# Patient Record
Sex: Female | Born: 1955 | Race: White | Hispanic: No | Marital: Married | Smoking: Never smoker
Health system: Southern US, Community
[De-identification: ages and names within clinical notes are randomized; demographics above are authoritative.]

## PROBLEM LIST (undated history)

## (undated) DIAGNOSIS — Z8489 Family history of other specified conditions: Secondary | ICD-10-CM

## (undated) DIAGNOSIS — M170 Bilateral primary osteoarthritis of knee: Secondary | ICD-10-CM

## (undated) DIAGNOSIS — K635 Polyp of colon: Secondary | ICD-10-CM

## (undated) DIAGNOSIS — K219 Gastro-esophageal reflux disease without esophagitis: Secondary | ICD-10-CM

## (undated) DIAGNOSIS — I10 Essential (primary) hypertension: Secondary | ICD-10-CM

## (undated) DIAGNOSIS — J189 Pneumonia, unspecified organism: Secondary | ICD-10-CM

## (undated) DIAGNOSIS — E559 Vitamin D deficiency, unspecified: Secondary | ICD-10-CM

## (undated) DIAGNOSIS — T884XXA Failed or difficult intubation, initial encounter: Secondary | ICD-10-CM

## (undated) HISTORY — PX: BREAST CYST ASPIRATION: SHX578

## (undated) HISTORY — PX: TONSILLECTOMY: SUR1361

## (undated) HISTORY — PX: CHOLECYSTECTOMY: SHX55

## (undated) HISTORY — PX: COLON SURGERY: SHX602

---

## 2004-05-24 ENCOUNTER — Ambulatory Visit: Payer: Self-pay | Admitting: Internal Medicine

## 2004-09-19 ENCOUNTER — Other Ambulatory Visit: Payer: Self-pay

## 2004-09-23 ENCOUNTER — Ambulatory Visit: Payer: Self-pay | Admitting: Obstetrics and Gynecology

## 2004-10-25 HISTORY — PX: ABDOMINAL HYSTERECTOMY: SHX81

## 2004-11-07 ENCOUNTER — Inpatient Hospital Stay: Payer: Self-pay | Admitting: Obstetrics and Gynecology

## 2004-11-23 ENCOUNTER — Ambulatory Visit: Payer: Self-pay | Admitting: Gynecologic Oncology

## 2005-05-25 ENCOUNTER — Ambulatory Visit: Payer: Self-pay | Admitting: Internal Medicine

## 2005-12-01 ENCOUNTER — Ambulatory Visit: Payer: Self-pay | Admitting: Gastroenterology

## 2006-05-28 ENCOUNTER — Ambulatory Visit: Payer: Self-pay | Admitting: Internal Medicine

## 2006-07-20 ENCOUNTER — Other Ambulatory Visit: Payer: Self-pay

## 2006-07-20 ENCOUNTER — Emergency Department: Payer: Self-pay | Admitting: Emergency Medicine

## 2007-04-19 ENCOUNTER — Emergency Department: Payer: Self-pay | Admitting: Emergency Medicine

## 2007-06-19 ENCOUNTER — Ambulatory Visit: Payer: Self-pay | Admitting: Internal Medicine

## 2008-06-19 ENCOUNTER — Ambulatory Visit: Payer: Self-pay | Admitting: Internal Medicine

## 2009-06-21 ENCOUNTER — Ambulatory Visit: Payer: Self-pay | Admitting: Internal Medicine

## 2010-06-23 ENCOUNTER — Ambulatory Visit: Payer: Self-pay | Admitting: Internal Medicine

## 2010-07-29 ENCOUNTER — Ambulatory Visit: Payer: Self-pay | Admitting: Gastroenterology

## 2010-07-29 HISTORY — PX: ESOPHAGOGASTRODUODENOSCOPY: SHX1529

## 2011-06-26 ENCOUNTER — Ambulatory Visit: Payer: Self-pay | Admitting: Internal Medicine

## 2012-06-26 ENCOUNTER — Ambulatory Visit: Payer: Self-pay | Admitting: Internal Medicine

## 2013-07-02 ENCOUNTER — Ambulatory Visit: Payer: Self-pay | Admitting: Family Medicine

## 2014-07-21 ENCOUNTER — Ambulatory Visit
Admit: 2014-07-21 | Disposition: A | Discharge status: Home / Self Care | Payer: Self-pay | Attending: Family Medicine | Admitting: Family Medicine

## 2015-02-23 ENCOUNTER — Other Ambulatory Visit: Payer: Self-pay | Admitting: Obstetrics and Gynecology

## 2015-02-23 DIAGNOSIS — Z1231 Encounter for screening mammogram for malignant neoplasm of breast: Secondary | ICD-10-CM

## 2015-07-22 ENCOUNTER — Ambulatory Visit
Admission: RE | Admit: 2015-07-22 | Discharge: 2015-07-22 | Disposition: A | Discharge status: Home / Self Care | Payer: 59 | Source: Ambulatory Visit | Attending: Obstetrics and Gynecology | Admitting: Obstetrics and Gynecology

## 2015-07-22 DIAGNOSIS — Z1231 Encounter for screening mammogram for malignant neoplasm of breast: Secondary | ICD-10-CM | POA: Diagnosis not present

## 2015-10-20 ENCOUNTER — Ambulatory Visit: Payer: 59 | Admitting: Anesthesiology

## 2015-10-20 ENCOUNTER — Ambulatory Visit
Admission: RE | Admit: 2015-10-20 | Discharge: 2015-10-20 | Disposition: A | Discharge status: Home / Self Care | Payer: 59 | Source: Ambulatory Visit | Attending: Unknown Physician Specialty | Admitting: Unknown Physician Specialty

## 2015-10-20 ENCOUNTER — Encounter
Admission: RE | Disposition: A | Discharge status: Home / Self Care | Payer: Self-pay | Source: Ambulatory Visit | Attending: Unknown Physician Specialty

## 2015-10-20 DIAGNOSIS — E669 Obesity, unspecified: Secondary | ICD-10-CM | POA: Diagnosis not present

## 2015-10-20 DIAGNOSIS — K640 First degree hemorrhoids: Secondary | ICD-10-CM | POA: Insufficient documentation

## 2015-10-20 DIAGNOSIS — I10 Essential (primary) hypertension: Secondary | ICD-10-CM | POA: Insufficient documentation

## 2015-10-20 DIAGNOSIS — Z6841 Body Mass Index (BMI) 40.0 and over, adult: Secondary | ICD-10-CM | POA: Insufficient documentation

## 2015-10-20 DIAGNOSIS — Z86010 Personal history of colonic polyps: Secondary | ICD-10-CM | POA: Insufficient documentation

## 2015-10-20 DIAGNOSIS — K621 Rectal polyp: Secondary | ICD-10-CM | POA: Insufficient documentation

## 2015-10-20 DIAGNOSIS — D123 Benign neoplasm of transverse colon: Secondary | ICD-10-CM | POA: Diagnosis not present

## 2015-10-20 DIAGNOSIS — Z79899 Other long term (current) drug therapy: Secondary | ICD-10-CM | POA: Insufficient documentation

## 2015-10-20 DIAGNOSIS — D122 Benign neoplasm of ascending colon: Secondary | ICD-10-CM | POA: Diagnosis not present

## 2015-10-20 HISTORY — PX: COLONOSCOPY WITH PROPOFOL: SHX5780

## 2015-10-20 SURGERY — COLONOSCOPY WITH PROPOFOL
Anesthesia: General

## 2015-10-20 MED ORDER — MIDAZOLAM HCL 5 MG/5ML IJ SOLN
INTRAMUSCULAR | Status: DC | PRN
  Administered 2015-10-20: 1 mg via INTRAVENOUS

## 2015-10-20 MED ORDER — PROPOFOL 500 MG/50ML IV EMUL
INTRAVENOUS | Status: DC | PRN
  Administered 2015-10-20: 100 ug/kg/min via INTRAVENOUS

## 2015-10-20 MED ORDER — FENTANYL CITRATE (PF) 100 MCG/2ML IJ SOLN
INTRAMUSCULAR | Status: DC | PRN
  Administered 2015-10-20: 50 ug via INTRAVENOUS

## 2015-10-20 MED ORDER — SODIUM CHLORIDE 0.9 % IV SOLN
INTRAVENOUS | Status: DC
  Administered 2015-10-20: 1000 mL via INTRAVENOUS

## 2015-10-20 MED ORDER — LIDOCAINE HCL (PF) 2 % IJ SOLN
INTRAMUSCULAR | Status: DC | PRN
  Administered 2015-10-20: 50 mg

## 2015-10-20 MED ORDER — PROPOFOL 10 MG/ML IV BOLUS
INTRAVENOUS | Status: DC | PRN
  Administered 2015-10-20: 10 mg via INTRAVENOUS
  Administered 2015-10-20: 50 mg via INTRAVENOUS
  Administered 2015-10-20: 10 mg via INTRAVENOUS

## 2015-10-20 NOTE — H&P (Signed)
   Primary Care Physician:  Marcello Fennel, MD Primary Gastroenterologist:  Dr. Vira Agar  Pre-Procedure History & Physical: HPI:  Angela Pena is a 60 y.o. female is here for an colonoscopy.   No past medical history on file.  No past surgical history on file.  Prior to Admission medications   Medication Sig Start Date End Date Taking? Authorizing Provider  amLODipine (NORVASC) 10 MG tablet Take 10 mg by mouth daily.   Yes Historical Provider, MD  calcium carbonate (OSCAL) 1500 (600 Ca) MG TABS tablet Take by mouth 2 (two) times daily with a meal.   Yes Historical Provider, MD  ergocalciferol (VITAMIN D2) 50000 units capsule Take 50,000 Units by mouth 2 (two) times a week.   Yes Historical Provider, MD  hydrochlorothiazide (HYDRODIURIL) 12.5 MG tablet Take 12.5 mg by mouth daily.   Yes Historical Provider, MD  irbesartan (AVAPRO) 150 MG tablet Take 150 mg by mouth daily.   Yes Historical Provider, MD  potassium chloride (KLOR-CON) 20 MEQ packet Take by mouth 2 (two) times daily.   Yes Historical Provider, MD    Allergies as of 10/13/2015  . (Not on File)    Family History  Problem Relation Age of Onset  . Breast cancer Mother     Social History   Social History  . Marital status: Married    Spouse name: N/A  . Number of children: N/A  . Years of education: N/A   Occupational History  . Not on file.   Social History Main Topics  . Smoking status: Not on file  . Smokeless tobacco: Not on file  . Alcohol use Not on file  . Drug use: Unknown  . Sexual activity: Not on file   Other Topics Concern  . Not on file   Social History Narrative  . No narrative on file    Review of Systems: See HPI, otherwise negative ROS  Physical Exam: BP (!) 169/83   Pulse 75   Temp 99.6 F (37.6 C) (Tympanic)   Resp 20   Ht 5\' 7"  (1.702 m)   Wt (!) 149.7 kg (330 lb)   SpO2 98%   BMI 51.69 kg/m  General:   Alert,  pleasant and cooperative in NAD Head:  Normocephalic and  atraumatic. Neck:  Supple; no masses or thyromegaly. Lungs:  Clear throughout to auscultation.    Heart:  Regular rate and rhythm. Abdomen:  Soft, nontender and nondistended. Normal bowel sounds, without guarding, and without rebound.   Neurologic:  Alert and  oriented x4;  grossly normal neurologically.  Impression/Plan: DEKOTA ZWAHLEN is here for an colonoscopy to be performed for [facility] colon polyp  Risks, benefits, limitations, and alternatives regarding  colonoscopy have been reviewed with the patient.  Questions have been answered.  All parties agreeable.   Gaylyn Cheers, MD  10/20/2015, 3:29 PM

## 2015-10-20 NOTE — Transfer of Care (Signed)
Immediate Anesthesia Transfer of Care Note  Patient: Angela Pena  Procedure(s) Performed: Procedure(s): COLONOSCOPY WITH PROPOFOL (N/A)  Patient Location: PACU  Anesthesia Type:General  Level of Consciousness: sedated  Airway & Oxygen Therapy: Patient Spontanous Breathing and Patient connected to nasal cannula oxygen  Post-op Assessment: Report given to RN and Post -op Vital signs reviewed and stable  Post vital signs: Reviewed and stable  Last Vitals:  Vitals:   10/20/15 1442  BP: (!) 169/83  Pulse: 75  Resp: 20  Temp: 37.6 C    Last Pain:  Vitals:   10/20/15 1442  TempSrc: Tympanic         Complications: No apparent anesthesia complications

## 2015-10-20 NOTE — Anesthesia Postprocedure Evaluation (Signed)
Anesthesia Post Note  Patient: Angela Pena  Procedure(s) Performed: Procedure(s) (LRB): COLONOSCOPY WITH PROPOFOL (N/A)  Patient location during evaluation: Endoscopy Anesthesia Type: General Level of consciousness: awake and alert Pain management: pain level controlled Vital Signs Assessment: post-procedure vital signs reviewed and stable Respiratory status: spontaneous breathing, nonlabored ventilation, respiratory function stable and patient connected to nasal cannula oxygen Cardiovascular status: blood pressure returned to baseline and stable Postop Assessment: no signs of nausea or vomiting Anesthetic complications: no    Last Vitals:  Vitals:   10/20/15 1618 10/20/15 1634  BP:  (!) 128/95  Pulse:  64  Resp:  18  Temp: 36.8 C     Last Pain:  Vitals:   10/20/15 1618  TempSrc: Tympanic                 Martha Clan

## 2015-10-20 NOTE — Anesthesia Preprocedure Evaluation (Signed)
Anesthesia Evaluation  Patient identified by MRN, date of birth, ID band Patient awake    Reviewed: Allergy & Precautions, NPO status , Patient's Chart, lab work & pertinent test results  History of Anesthesia Complications Negative for: history of anesthetic complications  Airway Mallampati: II  TM Distance: >3 FB Neck ROM: Full    Dental  (+) Poor Dentition   Pulmonary neg sleep apnea, neg COPD,    breath sounds clear to auscultation- rhonchi (-) wheezing      Cardiovascular Exercise Tolerance: Good hypertension, Pt. on medications (-) CAD and (-) Past MI  Rhythm:Regular Rate:Normal - Systolic murmurs and - Diastolic murmurs    Neuro/Psych negative psych ROS   GI/Hepatic negative GI ROS, Neg liver ROS,   Endo/Other  negative endocrine ROSneg diabetes  Renal/GU negative Renal ROS     Musculoskeletal negative musculoskeletal ROS (+)   Abdominal (+) + obese,   Peds  Hematology negative hematology ROS (+)   Anesthesia Other Findings   Reproductive/Obstetrics                             Anesthesia Physical Anesthesia Plan  ASA: II  Anesthesia Plan: General   Post-op Pain Management:    Induction: Intravenous  Airway Management Planned: Natural Airway  Additional Equipment:   Intra-op Plan:   Post-operative Plan:   Informed Consent: I have reviewed the patients History and Physical, chart, labs and discussed the procedure including the risks, benefits and alternatives for the proposed anesthesia with the patient or authorized representative who has indicated his/her understanding and acceptance.     Plan Discussed with: Anesthesiologist and CRNA  Anesthesia Plan Comments:         Anesthesia Quick Evaluation

## 2015-10-20 NOTE — Op Note (Signed)
[facility] Gastroenterology Patient Name: Angela Pena Procedure Date: 10/20/2015 3:32 PM MRN: [id number] Account #: [id number] Date of Birth: May 19, 1955 Admit Type: Outpatient Age: 60 Room: [facility] ENDO ROOM 4 Gender: Female Note Status: Finalized Procedure:            Colonoscopy Indications:          High risk colon cancer surveillance: Personal history                        of colonic polyps Providers:            Manya Silvas, MD Referring MD:         Caprice Renshaw MD (Referring MD) Medicines:            Propofol per Anesthesia Complications:        No immediate complications. Procedure:            Pre-Anesthesia Assessment:                       - After reviewing the risks and benefits, the patient                        was deemed in satisfactory condition to undergo the                        procedure.                       After obtaining informed consent, the colonoscope was                        passed under direct vision. Throughout the procedure,                        the patient's blood pressure, pulse, and oxygen                        saturations were monitored continuously. The                        Colonoscope was introduced through the anus and                        advanced to the the cecum, identified by appendiceal                        orifice and ileocecal valve. The colonoscopy was                        performed without difficulty. The patient tolerated the                        procedure well. The quality of the bowel preparation                        was good. Findings:      A small polyp was found in the ascending colon. The polyp was sessile.       The polyp was removed with a hot snare. Resection and retrieval were       complete.      Two sessile polyps were found  in the transverse colon. The polyps were       small in size. These polyps were removed with a hot snare. Resection and       retrieval were complete.      A diminutive polyp was found in the ascending colon. The polyp was       sessile. The polyp was removed with a jumbo cold forceps. Resection and       retrieval were complete.      Two sessile polyps were found in the rectum. The polyps were diminutive       in size. These polyps were removed with a jumbo cold forceps. Resection       and retrieval were complete.      Internal hemorrhoids were found during endoscopy. The hemorrhoids were       small and Grade I (internal hemorrhoids that do not prolapse). Impression:           - One small polyp in the ascending colon, removed with                        a hot snare. Resected and retrieved.                       - Two small polyps in the transverse colon, removed                        with a hot snare. Resected and retrieved.                       - One diminutive polyp in the ascending colon, removed                        with a jumbo cold forceps. Resected and retrieved.                       - Two diminutive polyps in the rectum, removed with a                        jumbo cold forceps. Resected and retrieved.                       - Internal hemorrhoids. Recommendation:       - Await pathology results. Manya Silvas, MD 10/20/2015 4:14:57 PM This report has been signed electronically. Number of Addenda: 0 Note Initiated On: 10/20/2015 3:32 PM Scope Withdrawal Time: 0 hours 19 minutes 46 seconds  Total Procedure Duration: 0 hours 24 minutes 55 seconds       [facility]

## 2015-10-21 ENCOUNTER — Encounter: Payer: Self-pay | Admitting: Unknown Physician Specialty

## 2015-10-22 LAB — SURGICAL PATHOLOGY

## 2016-02-24 ENCOUNTER — Other Ambulatory Visit: Payer: Self-pay | Admitting: Obstetrics and Gynecology

## 2016-02-24 DIAGNOSIS — Z1231 Encounter for screening mammogram for malignant neoplasm of breast: Secondary | ICD-10-CM

## 2016-04-18 DIAGNOSIS — E559 Vitamin D deficiency, unspecified: Secondary | ICD-10-CM | POA: Diagnosis not present

## 2016-07-24 ENCOUNTER — Ambulatory Visit
Admission: RE | Admit: 2016-07-24 | Discharge: 2016-07-24 | Disposition: A | Discharge status: Home / Self Care | Payer: 59 | Source: Ambulatory Visit | Attending: Obstetrics and Gynecology | Admitting: Obstetrics and Gynecology

## 2016-07-24 DIAGNOSIS — Z1231 Encounter for screening mammogram for malignant neoplasm of breast: Secondary | ICD-10-CM

## 2016-11-09 DIAGNOSIS — M25562 Pain in left knee: Secondary | ICD-10-CM | POA: Diagnosis not present

## 2016-11-09 DIAGNOSIS — M1712 Unilateral primary osteoarthritis, left knee: Secondary | ICD-10-CM | POA: Diagnosis not present

## 2016-11-29 ENCOUNTER — Other Ambulatory Visit: Payer: Self-pay | Admitting: Orthopedic Surgery

## 2016-11-29 DIAGNOSIS — M1712 Unilateral primary osteoarthritis, left knee: Secondary | ICD-10-CM

## 2016-11-29 DIAGNOSIS — M25562 Pain in left knee: Secondary | ICD-10-CM

## 2016-11-29 DIAGNOSIS — M2392 Unspecified internal derangement of left knee: Secondary | ICD-10-CM

## 2016-12-08 ENCOUNTER — Ambulatory Visit
Admission: RE | Admit: 2016-12-08 | Discharge: 2016-12-08 | Disposition: A | Discharge status: Home / Self Care | Payer: 59 | Source: Ambulatory Visit | Attending: Orthopedic Surgery | Admitting: Orthopedic Surgery

## 2016-12-08 DIAGNOSIS — M2392 Unspecified internal derangement of left knee: Secondary | ICD-10-CM | POA: Insufficient documentation

## 2016-12-08 DIAGNOSIS — M25462 Effusion, left knee: Secondary | ICD-10-CM | POA: Insufficient documentation

## 2016-12-08 DIAGNOSIS — M25562 Pain in left knee: Secondary | ICD-10-CM | POA: Insufficient documentation

## 2016-12-08 DIAGNOSIS — M1712 Unilateral primary osteoarthritis, left knee: Secondary | ICD-10-CM | POA: Insufficient documentation

## 2016-12-08 DIAGNOSIS — M179 Osteoarthritis of knee, unspecified: Secondary | ICD-10-CM | POA: Diagnosis not present

## 2017-01-23 DIAGNOSIS — E559 Vitamin D deficiency, unspecified: Secondary | ICD-10-CM | POA: Diagnosis not present

## 2017-01-24 DIAGNOSIS — S83232A Complex tear of medial meniscus, current injury, left knee, initial encounter: Secondary | ICD-10-CM | POA: Diagnosis not present

## 2017-01-24 DIAGNOSIS — M1712 Unilateral primary osteoarthritis, left knee: Secondary | ICD-10-CM | POA: Diagnosis not present

## 2017-01-30 DIAGNOSIS — E559 Vitamin D deficiency, unspecified: Secondary | ICD-10-CM | POA: Diagnosis not present

## 2017-02-19 DIAGNOSIS — Z23 Encounter for immunization: Secondary | ICD-10-CM | POA: Diagnosis not present

## 2017-02-22 ENCOUNTER — Other Ambulatory Visit: Payer: Self-pay

## 2017-02-22 ENCOUNTER — Encounter
Admission: RE | Admit: 2017-02-22 | Discharge: 2017-02-22 | Disposition: A | Discharge status: Home / Self Care | Payer: 59 | Source: Ambulatory Visit | Attending: Surgery | Admitting: Surgery

## 2017-02-22 DIAGNOSIS — Z01812 Encounter for preprocedural laboratory examination: Secondary | ICD-10-CM | POA: Diagnosis not present

## 2017-02-22 DIAGNOSIS — Z01810 Encounter for preprocedural cardiovascular examination: Secondary | ICD-10-CM | POA: Diagnosis not present

## 2017-02-22 DIAGNOSIS — I10 Essential (primary) hypertension: Secondary | ICD-10-CM | POA: Diagnosis not present

## 2017-02-22 HISTORY — DX: Bilateral primary osteoarthritis of knee: M17.0

## 2017-02-22 HISTORY — DX: Gastro-esophageal reflux disease without esophagitis: K21.9

## 2017-02-22 HISTORY — DX: Essential (primary) hypertension: I10

## 2017-02-22 HISTORY — DX: Vitamin D deficiency, unspecified: E55.9

## 2017-02-22 HISTORY — DX: Morbid (severe) obesity due to excess calories: E66.01

## 2017-02-22 HISTORY — DX: Family history of other specified conditions: Z84.89

## 2017-02-22 HISTORY — DX: Polyp of colon: K63.5

## 2017-02-22 HISTORY — DX: Pneumonia, unspecified organism: J18.9

## 2017-02-22 LAB — BASIC METABOLIC PANEL
ANION GAP: 11 (ref 5–15)
BUN: 18 mg/dL (ref 6–20)
CALCIUM: 9.6 mg/dL (ref 8.9–10.3)
CHLORIDE: 101 mmol/L (ref 101–111)
CO2: 28 mmol/L (ref 22–32)
CREATININE: 0.9 mg/dL (ref 0.44–1.00)
GFR calc non Af Amer: >60 mL/min (ref >60)
GLUCOSE: 99 mg/dL (ref 65–99)
Potassium: 4.1 mmol/L (ref 3.5–5.1)
Sodium: 140 mmol/L (ref 135–145)

## 2017-02-22 NOTE — Patient Instructions (Signed)
Your procedure is scheduled on: Thursday, March 08, 2017 Report to Same Day Surgery on the 2nd floor in the [facility]. To find out your arrival time, please call [phone] between 1PM - 3PM on: Wednesday, December 12  REMEMBER: Instructions that are not followed completely may result in serious medical risk, up to and including death; or upon the discretion of your surgeon and anesthesiologist your surgery may need to be rescheduled.  Do not eat food after midnight the night before your procedure.  No gum chewing or hard candies.  You may however, drink CLEAR liquids up to 2 hours before you are scheduled to arrive at the hospital for your procedure.  Do not drink clear liquids within 2 hours of the start of your surgery.  Clear liquids include: - water  - apple juice without pulp - clear gatorade - black coffee or tea (Do NOT add anything to the coffee or tea) Do NOT drink anything that is not on this list.  No Alcohol for 24 hours before or after surgery.  No Smoking including e-cigarettes for 24 hours prior to surgery. No chewable tobacco products for at least 6 hours prior to surgery. No nicotine patches on the day of surgery.  Notify your doctor if there is any change in your medical condition (cold, fever, infection).  Do not wear jewelry, make-up, hairpins, clips or nail polish.  Do not wear lotions, powders, or perfumes. You may wear deodorant.  Do not shave 48 hours prior to surgery. Men may shave face and neck.  Contacts and dentures may not be worn into surgery.  Do not bring valuables to the hospital. [facility] is not responsible for any belongings or valuables.   TAKE THESE MEDICATIONS THE MORNING OF SURGERY WITH A SIP OF WATER:  NONE  Use CHG Soap or wipes as directed on instruction sheet.  NOW!  Stop Anti-inflammatories such as Advil, Aleve, Ibuprofen, Motrin, Naproxen, Naprosyn, Goodie powder, or aspirin products. (May take Tylenol or Acetaminophen  if needed.)  NOW!  Stop ANY OVER THE COUNTER supplements until after surgery. (May continue Vitamin D, Vitamin B, and multivitamin.)  If you are being discharged the day of surgery, you will not be allowed to drive home. You will need someone to drive you home and stay with you that night.   If you are taking public transportation, you will need to have a responsible adult to with you.  Please call the number above if you have any questions about these instructions.

## 2017-03-07 MED ORDER — CEFAZOLIN SODIUM 10 G IJ SOLR
3.0000 g | Freq: Once | INTRAMUSCULAR | Status: AC
  Administered 2017-03-08: 3 g via INTRAVENOUS
  Filled 2017-03-07: qty 3000

## 2017-03-08 ENCOUNTER — Ambulatory Visit: Payer: 59 | Admitting: Certified Registered Nurse Anesthetist

## 2017-03-08 ENCOUNTER — Encounter
Admission: RE | Disposition: A | Discharge status: Home / Self Care | Payer: Self-pay | Source: Ambulatory Visit | Attending: Surgery

## 2017-03-08 ENCOUNTER — Ambulatory Visit
Admission: RE | Admit: 2017-03-08 | Discharge: 2017-03-08 | Disposition: A | Discharge status: Home / Self Care | Payer: 59 | Source: Ambulatory Visit | Attending: Surgery | Admitting: Surgery

## 2017-03-08 DIAGNOSIS — Z86010 Personal history of colonic polyps: Secondary | ICD-10-CM | POA: Diagnosis not present

## 2017-03-08 DIAGNOSIS — Z9049 Acquired absence of other specified parts of digestive tract: Secondary | ICD-10-CM | POA: Diagnosis not present

## 2017-03-08 DIAGNOSIS — S83272A Complex tear of lateral meniscus, current injury, left knee, initial encounter: Secondary | ICD-10-CM | POA: Diagnosis not present

## 2017-03-08 DIAGNOSIS — S83282A Other tear of lateral meniscus, current injury, left knee, initial encounter: Secondary | ICD-10-CM | POA: Diagnosis not present

## 2017-03-08 DIAGNOSIS — X58XXXA Exposure to other specified factors, initial encounter: Secondary | ICD-10-CM | POA: Insufficient documentation

## 2017-03-08 DIAGNOSIS — S83232A Complex tear of medial meniscus, current injury, left knee, initial encounter: Secondary | ICD-10-CM | POA: Diagnosis not present

## 2017-03-08 DIAGNOSIS — I10 Essential (primary) hypertension: Secondary | ICD-10-CM | POA: Insufficient documentation

## 2017-03-08 DIAGNOSIS — Z833 Family history of diabetes mellitus: Secondary | ICD-10-CM | POA: Diagnosis not present

## 2017-03-08 DIAGNOSIS — Z9889 Other specified postprocedural states: Secondary | ICD-10-CM | POA: Diagnosis not present

## 2017-03-08 DIAGNOSIS — Z90710 Acquired absence of both cervix and uterus: Secondary | ICD-10-CM | POA: Diagnosis not present

## 2017-03-08 DIAGNOSIS — Z6841 Body Mass Index (BMI) 40.0 and over, adult: Secondary | ICD-10-CM | POA: Insufficient documentation

## 2017-03-08 DIAGNOSIS — K219 Gastro-esophageal reflux disease without esophagitis: Secondary | ICD-10-CM | POA: Insufficient documentation

## 2017-03-08 DIAGNOSIS — M1712 Unilateral primary osteoarthritis, left knee: Secondary | ICD-10-CM | POA: Diagnosis not present

## 2017-03-08 DIAGNOSIS — Z8249 Family history of ischemic heart disease and other diseases of the circulatory system: Secondary | ICD-10-CM | POA: Diagnosis not present

## 2017-03-08 DIAGNOSIS — Z79899 Other long term (current) drug therapy: Secondary | ICD-10-CM | POA: Insufficient documentation

## 2017-03-08 DIAGNOSIS — Z888 Allergy status to other drugs, medicaments and biological substances status: Secondary | ICD-10-CM | POA: Diagnosis not present

## 2017-03-08 DIAGNOSIS — Z8379 Family history of other diseases of the digestive system: Secondary | ICD-10-CM | POA: Diagnosis not present

## 2017-03-08 DIAGNOSIS — M170 Bilateral primary osteoarthritis of knee: Secondary | ICD-10-CM | POA: Insufficient documentation

## 2017-03-08 DIAGNOSIS — E559 Vitamin D deficiency, unspecified: Secondary | ICD-10-CM | POA: Diagnosis not present

## 2017-03-08 DIAGNOSIS — S83242A Other tear of medial meniscus, current injury, left knee, initial encounter: Secondary | ICD-10-CM | POA: Diagnosis not present

## 2017-03-08 HISTORY — PX: KNEE ARTHROSCOPY WITH MEDIAL MENISECTOMY: SHX5651

## 2017-03-08 HISTORY — DX: Failed or difficult intubation, initial encounter: T88.4XXA

## 2017-03-08 SURGERY — ARTHROSCOPY, KNEE, WITH MEDIAL MENISCECTOMY
Anesthesia: General | Site: Knee | Laterality: Left | Wound class: Clean

## 2017-03-08 MED ORDER — MIDAZOLAM HCL 2 MG/2ML IJ SOLN
INTRAMUSCULAR | Status: AC
  Filled 2017-03-08: qty 2

## 2017-03-08 MED ORDER — ONDANSETRON HCL 4 MG PO TABS: 4.0000 mg | ORAL_TABLET | Freq: Four times a day (QID) | ORAL | Status: DC | PRN

## 2017-03-08 MED ORDER — ONDANSETRON HCL 4 MG/2ML IJ SOLN: 4.0000 mg | Freq: Four times a day (QID) | INTRAMUSCULAR | Status: DC | PRN

## 2017-03-08 MED ORDER — METOCLOPRAMIDE HCL 5 MG/ML IJ SOLN: 5.0000 mg | Freq: Three times a day (TID) | INTRAMUSCULAR | Status: DC | PRN

## 2017-03-08 MED ORDER — HYDROCODONE-ACETAMINOPHEN 5-325 MG PO TABS
ORAL_TABLET | ORAL | Status: AC
  Filled 2017-03-08: qty 1

## 2017-03-08 MED ORDER — LACTATED RINGERS IV SOLN
Freq: Once | INTRAVENOUS | Status: AC
  Administered 2017-03-08: 14:00:00 via INTRAVENOUS

## 2017-03-08 MED ORDER — METOCLOPRAMIDE HCL 10 MG PO TABS: 5.0000 mg | ORAL_TABLET | Freq: Three times a day (TID) | ORAL | Status: DC | PRN

## 2017-03-08 MED ORDER — BUPIVACAINE-EPINEPHRINE (PF) 0.5% -1:200000 IJ SOLN
INTRAMUSCULAR | Status: DC | PRN
  Administered 2017-03-08: 20 mL

## 2017-03-08 MED ORDER — PROPOFOL 10 MG/ML IV BOLUS
INTRAVENOUS | Status: DC | PRN
Start: 2017-03-08 — End: 2017-03-08
  Administered 2017-03-08: 150 mg via INTRAVENOUS

## 2017-03-08 MED ORDER — MIDAZOLAM HCL 2 MG/2ML IJ SOLN
INTRAMUSCULAR | Status: DC | PRN
  Administered 2017-03-08: 2 mg via INTRAVENOUS

## 2017-03-08 MED ORDER — PROPOFOL 10 MG/ML IV BOLUS
INTRAVENOUS | Status: AC
  Filled 2017-03-08: qty 20

## 2017-03-08 MED ORDER — POTASSIUM CHLORIDE IN NACL 20-0.9 MEQ/L-% IV SOLN: INTRAVENOUS | Status: DC

## 2017-03-08 MED ORDER — FENTANYL CITRATE (PF) 100 MCG/2ML IJ SOLN
INTRAMUSCULAR | Status: AC
  Filled 2017-03-08: qty 2

## 2017-03-08 MED ORDER — FAMOTIDINE 20 MG PO TABS
ORAL_TABLET | ORAL | Status: AC
  Filled 2017-03-08: qty 1

## 2017-03-08 MED ORDER — HYDROCODONE-ACETAMINOPHEN 5-325 MG PO TABS
1.0000 | ORAL_TABLET | ORAL | Status: DC | PRN
  Administered 2017-03-08: 1 via ORAL

## 2017-03-08 MED ORDER — LIDOCAINE HCL (PF) 2 % IJ SOLN
INTRAMUSCULAR | Status: AC
  Filled 2017-03-08: qty 10

## 2017-03-08 MED ORDER — SUCCINYLCHOLINE CHLORIDE 20 MG/ML IJ SOLN
INTRAMUSCULAR | Status: DC | PRN
  Administered 2017-03-08: 120 mg via INTRAVENOUS

## 2017-03-08 MED ORDER — LIDOCAINE HCL 1 % IJ SOLN
INTRAMUSCULAR | Status: DC | PRN
  Administered 2017-03-08: 60 mL via INTRAMUSCULAR

## 2017-03-08 MED ORDER — SUGAMMADEX SODIUM 200 MG/2ML IV SOLN
INTRAVENOUS | Status: DC | PRN
  Administered 2017-03-08: 308.4 mg via INTRAVENOUS

## 2017-03-08 MED ORDER — FAMOTIDINE 20 MG PO TABS
20.0000 mg | ORAL_TABLET | Freq: Once | ORAL | Status: AC
  Administered 2017-03-08: 20 mg via ORAL

## 2017-03-08 MED ORDER — ONDANSETRON HCL 4 MG/2ML IJ SOLN
INTRAMUSCULAR | Status: DC | PRN
  Administered 2017-03-08: 4 mg via INTRAVENOUS

## 2017-03-08 MED ORDER — ROCURONIUM BROMIDE 100 MG/10ML IV SOLN
INTRAVENOUS | Status: DC | PRN
  Administered 2017-03-08: 35 mg via INTRAVENOUS
  Administered 2017-03-08: 5 mg via INTRAVENOUS

## 2017-03-08 MED ORDER — OXYCODONE HCL 5 MG PO TABS: 5.0000 mg | ORAL_TABLET | Freq: Once | ORAL | Status: DC | PRN

## 2017-03-08 MED ORDER — DEXAMETHASONE SODIUM PHOSPHATE 10 MG/ML IJ SOLN
INTRAMUSCULAR | Status: DC | PRN
  Administered 2017-03-08: 10 mg via INTRAVENOUS

## 2017-03-08 MED ORDER — FENTANYL CITRATE (PF) 100 MCG/2ML IJ SOLN: 25.0000 ug | INTRAMUSCULAR | Status: DC | PRN

## 2017-03-08 MED ORDER — FENTANYL CITRATE (PF) 100 MCG/2ML IJ SOLN
INTRAMUSCULAR | Status: DC | PRN
  Administered 2017-03-08 (×2): 50 ug via INTRAVENOUS

## 2017-03-08 MED ORDER — OXYCODONE HCL 5 MG/5ML PO SOLN: 5.0000 mg | Freq: Once | ORAL | Status: DC | PRN

## 2017-03-08 MED ORDER — LIDOCAINE HCL (CARDIAC) 20 MG/ML IV SOLN
INTRAVENOUS | Status: DC | PRN
  Administered 2017-03-08: 40 mg via INTRAVENOUS

## 2017-03-08 MED ORDER — PHENYLEPHRINE HCL 10 MG/ML IJ SOLN
INTRAMUSCULAR | Status: DC | PRN
Start: 2017-03-08 — End: 2017-03-08
  Administered 2017-03-08 (×2): 100 ug via INTRAVENOUS

## 2017-03-08 MED ORDER — EPHEDRINE SULFATE 50 MG/ML IJ SOLN
INTRAMUSCULAR | Status: DC | PRN
  Administered 2017-03-08: 10 mg via INTRAVENOUS

## 2017-03-08 SURGICAL SUPPLY — 33 items
BAG COUNTER SPONGE EZ (MISCELLANEOUS) IMPLANT
BANDAGE ACE 6X5 VEL STRL LF (GAUZE/BANDAGES/DRESSINGS) ×3 IMPLANT
BLADE FULL RADIUS 3.5 (BLADE) ×3 IMPLANT
BLADE SHAVER 4.5X7 STR FR (MISCELLANEOUS) ×3 IMPLANT
CHLORAPREP W/TINT 26ML (MISCELLANEOUS) ×3 IMPLANT
COUNTER SPONGE BAG EZ (MISCELLANEOUS)
CUFF TOURN 24 STER (MISCELLANEOUS) IMPLANT
CUFF TOURN 30 STER DUAL PORT (MISCELLANEOUS) ×3 IMPLANT
DRAPE IMP U-DRAPE 54X76 (DRAPES) ×3 IMPLANT
ELECT REM PT RETURN 9FT ADLT (ELECTROSURGICAL) ×3
ELECTRODE REM PT RTRN 9FT ADLT (ELECTROSURGICAL) ×1 IMPLANT
GAUZE SPONGE 4X4 12PLY STRL (GAUZE/BANDAGES/DRESSINGS) ×3 IMPLANT
GLOVE BIO SURGEON STRL SZ8 (GLOVE) ×6 IMPLANT
GLOVE BIOGEL M 7.0 STRL (GLOVE) ×6 IMPLANT
GLOVE BIOGEL PI IND STRL 7.5 (GLOVE) ×1 IMPLANT
GLOVE BIOGEL PI INDICATOR 7.5 (GLOVE) ×2
GLOVE INDICATOR 8.0 STRL GRN (GLOVE) ×3 IMPLANT
GOWN STRL REUS W/ TWL LRG LVL3 (GOWN DISPOSABLE) ×1 IMPLANT
GOWN STRL REUS W/ TWL XL LVL3 (GOWN DISPOSABLE) ×1 IMPLANT
GOWN STRL REUS W/TWL LRG LVL3 (GOWN DISPOSABLE) ×2
GOWN STRL REUS W/TWL XL LVL3 (GOWN DISPOSABLE) ×2
IV LACTATED RINGER IRRG 3000ML (IV SOLUTION) ×2
IV LR IRRIG 3000ML ARTHROMATIC (IV SOLUTION) ×1 IMPLANT
KIT RM TURNOVER STRD PROC AR (KITS) ×3 IMPLANT
MANIFOLD NEPTUNE II (INSTRUMENTS) ×3 IMPLANT
NEEDLE HYPO 21X1.5 SAFETY (NEEDLE) ×3 IMPLANT
PACK ARTHROSCOPY KNEE (MISCELLANEOUS) ×3 IMPLANT
PENCIL ELECTRO HAND CTR (MISCELLANEOUS) ×3 IMPLANT
SUT PROLENE 4 0 PS 2 18 (SUTURE) ×3 IMPLANT
SUT TICRON COATED BLUE 2 0 30 (SUTURE) IMPLANT
SYR 50ML LL SCALE MARK (SYRINGE) ×3 IMPLANT
TUBING ARTHRO INFLOW-ONLY STRL (TUBING) ×3 IMPLANT
WAND HAND CNTRL MULTIVAC 90 (MISCELLANEOUS) ×3 IMPLANT

## 2017-03-08 NOTE — Transfer of Care (Signed)
Immediate Anesthesia Transfer of Care Note  Patient: Angela Pena  Procedure(s) Performed: KNEE ARTHROSCOPY WITH MEDIAL MENISECTOMY (Left Knee)  Patient Location: PACU  Anesthesia Type:General  Level of Consciousness: awake  Airway & Oxygen Therapy: Patient Spontanous Breathing and Patient connected to face mask oxygen  Post-op Assessment: Report given to RN and Post -op Vital signs reviewed and stable  Post vital signs: Reviewed and stable  Last Vitals:  Vitals:   03/08/17 1342  BP: (!) 158/90  Pulse: 88  Resp: 20  Temp: 36.8 C  SpO2: 96%    Last Pain:  Vitals:   03/08/17 1342  TempSrc: Oral      Patients Stated Pain Goal: 2 (58/83/25 4982)  Complications: No apparent anesthesia complications

## 2017-03-08 NOTE — H&P (Signed)
Paper H&P to be scanned into permanent record. H&P reviewed and patient re-examined. No changes. 

## 2017-03-08 NOTE — Op Note (Signed)
03/08/2017  4:11 PM  Patient:   Angela Pena  Pre-Op Diagnosis:   Complex medial meniscus tear with underlying degenerative joint disease, left knee.  Postoperative diagnosis:   Complex medial meniscus tear with lateral meniscus tear and underlying degenerative joint disease, left knee.  Procedure:   Arthroscopic partial medial and lateral meniscectomies, abrasion chondroplasty of grade 3 chondromalacial changes of femoral trochlea, left knee.  Surgeon:   Pascal Lux, M.D.  Anesthesia:   General LMA.  Findings:   As above. The anterior and posterior cruciate ligaments both were in excellent condition. There were grade 4 chondromalacial changes involving the lateral portion of the patella, and grade 3 chondral malacia changes involving both the weightbearing portion of the medial femoral condyle and the medial tibial plateau. Laterally, the articular surfaces of the patella and femur both were in satisfactory condition with only some mild fissuring of the lateral tibial plateau surface.  Complications:   None.  EBL:   2 cc.  Total fluids:   300 cc of crystalloid.  Tourniquet time:   None  Drains:   None  Closure:   4-0 Prolene interrupted sutures.  Brief clinical note:   The patient is a 61 year old female with a several month history of medial sided left knee pain. Her symptoms have persisted despite medications, activity modification, injection, etc. Her history and examination are consistent with a medial meniscus tear confirmed by MRI scan. The patient presents at this time for arthroscopy, debridement, and partial medial meniscectomy.  Procedure:   The patient was brought into the operating room and lain in the supine position. After adequate general laryngeal mask anesthesia was obtained, a timeout was performed to verify the appropriate side. The patient's left knee was injected sterilely using a solution of 30 cc of 1% lidocaine and 30 cc of 0.5% Sensorcaine with  epinephrine. The left lower extremity was prepped with ChloraPrep solution before being draped sterilely. Preoperative antibiotics were administered. The expected portal sites were injected with 0.5% Sensorcaine with epinephrine before the camera was placed in the anterolateral portal and instrumentation performed through the anteromedial portal. The knee was sequentially examined beginning in the suprapatellar pouch, then progressing to the patellofemoral space, the medial gutter compartment, the notch, and finally the lateral compartment and gutter. The findings were as described above. Abundant reactive synovial tissues anteriorly were debrided using the full-radius resector in order to improve visualization. The medial meniscus was probed and demonstrated a complex tear of the medial meniscus with several unstable flap components. These areas were debrided back to stable margins using combination of the side-biting and straight baskets, as well as the full-radius resector. Subsequent probing of the remaining rim demonstrated excellent stability. Laterally, the meniscus was noted to have several areas of central tearing.  These areas were debrided back to stable margins using the full-radius resector.  The more peripheral portions of the meniscus were stable to probing. The areas of loose articular cartilage along the femoral trochlea were debrided back to stable margins using the full-radius resector. The instruments were removed from the joint after suctioning the excess fluid. The portal sites were closed using 4-0 Prolene interrupted sutures before a sterile bulky dressing was applied to the knee. The patient was then awakened, extubated, and returned to the recovery room in satisfactory condition after tolerating the procedure well.

## 2017-03-08 NOTE — Anesthesia Procedure Notes (Signed)
Procedure Name: Intubation Date/Time: 03/08/2017 3:15 PM Performed by: Allean Found, CRNA Pre-anesthesia Checklist: Patient identified, Emergency Drugs available, Suction available, Patient being monitored and Timeout performed Patient Re-evaluated:Patient Re-evaluated prior to induction Oxygen Delivery Method: Circle system utilized Preoxygenation: Pre-oxygenation with 100% oxygen Induction Type: IV induction Ventilation: Mask ventilation without difficulty Laryngoscope Size: Mac and 3 Grade View: Grade I Tube type: Oral Tube size: 7.5 mm Number of attempts: 1 Airway Equipment and Method: Stylet Placement Confirmation: ETT inserted through vocal cords under direct vision,  positive ETCO2 and breath sounds checked- equal and bilateral Secured at: 22 cm Tube secured with: Tape Dental Injury: Teeth and Oropharynx as per pre-operative assessment

## 2017-03-08 NOTE — Anesthesia Post-op Follow-up Note (Signed)
Anesthesia QCDR form completed.        

## 2017-03-08 NOTE — Discharge Instructions (Addendum)
Keep dressing dry and intact.  May shower after dressing changed on post-op day #4 (Monday).  Cover sutures with Band-Aids after drying off. Apply ice frequently to knee. Take ibuprofen 600-800 mg TID with meals for 7-10 days, then as necessary. Take pain medication as prescribed or ES Tylenol when needed.  May weight-bear as tolerated - use crutches or walker as needed. Follow-up in 10-14 days or as scheduled.  AMBULATORY SURGERY  DISCHARGE INSTRUCTIONS   1) The drugs that you were given will stay in your system until tomorrow so for the next 24 hours you should not:  A) Drive an automobile B) Make any legal decisions C) Drink any alcoholic beverage   2) You may resume regular meals tomorrow.  Today it is better to start with liquids and gradually work up to solid foods.  You may eat anything you prefer, but it is better to start with liquids, then soup and crackers, and gradually work up to solid foods.   3) Please notify your doctor immediately if you have any unusual bleeding, trouble breathing, redness and pain at the surgery site, drainage, fever, or pain not relieved by medication.    4) Additional Instructions:        Please contact your physician with any problems or Same Day Surgery at [phone], Monday through Friday 6 am to 4 pm, or [place] [facility] [place] number at [phone].

## 2017-03-08 NOTE — Anesthesia Preprocedure Evaluation (Addendum)
Anesthesia Evaluation  Patient identified by MRN, date of birth, ID band Patient awake    Reviewed: Allergy & Precautions, H&P , NPO status , Patient's Chart, lab work & pertinent test results  History of Anesthesia Complications (+) Family history of anesthesia reaction and history of anesthetic complications  Airway Mallampati: I  TM Distance: >3 FB Neck ROM: full    Dental  (+) Chipped, Poor Dentition   Pulmonary pneumonia,           Cardiovascular Exercise Tolerance: Good hypertension, (-) angina(-) Past MI and (-) DOE      Neuro/Psych negative neurological ROS  negative psych ROS   GI/Hepatic Neg liver ROS, GERD  Medicated and Controlled,  Endo/Other  negative endocrine ROS  Renal/GU      Musculoskeletal  (+) Arthritis ,   Abdominal   Peds  Hematology negative hematology ROS (+)   Anesthesia Other Findings Past Medical History: No date: Acid reflux No date: Colon polyps No date: Family history of adverse reaction to anesthesia     Comment:  father had short neck and was difficult intubating No date: Hypertension No date: Morbid obesity ([place]) No date: Osteoarthritis of both knees No date: Pneumonia No date: Vitamin D deficiency  Past Surgical History: 10/2004: ABDOMINAL HYSTERECTOMY No date: BREAST CYST ASPIRATION; Right No date: CHOLECYSTECTOMY 10/20/2015: COLONOSCOPY WITH PROPOFOL; N/A     Comment:  Procedure: COLONOSCOPY WITH PROPOFOL;  Surgeon: Manya Silvas, MD;  Location: [facility];  Service:               Endoscopy;  Laterality: N/A; 07/29/2010: ESOPHAGOGASTRODUODENOSCOPY No date: TONSILLECTOMY  BMI    Body Mass Index:  53.25 kg/m      Reproductive/Obstetrics negative OB ROS                            Anesthesia Physical Anesthesia Plan  ASA: III  Anesthesia Plan: General ETT   Post-op Pain Management:    Induction:  Intravenous  PONV Risk Score and Plan: Ondansetron, Midazolam and Dexamethasone  Airway Management Planned: Oral ETT and Video Laryngoscope Planned  Additional Equipment:   Intra-op Plan:   Post-operative Plan: Extubation in OR  Informed Consent: I have reviewed the patients History and Physical, chart, labs and discussed the procedure including the risks, benefits and alternatives for the proposed anesthesia with the patient or authorized representative who has indicated his/her understanding and acceptance.   Dental Advisory Given  Plan Discussed with: Anesthesiologist, CRNA and Surgeon  Anesthesia Plan Comments: (Patient consented for risks of anesthesia including but not limited to:  - adverse reactions to medications - damage to teeth, lips or other oral mucosa - sore throat or hoarseness - Damage to heart, brain, lungs or loss of life  Patient voiced understanding.)        Anesthesia Quick Evaluation

## 2017-03-09 ENCOUNTER — Encounter: Payer: Self-pay | Admitting: Surgery

## 2017-03-12 NOTE — Anesthesia Postprocedure Evaluation (Signed)
Anesthesia Post Note  Patient: TULSI CROSSETT  Procedure(s) Performed: KNEE ARTHROSCOPY WITH MEDIAL MENISECTOMY (Left Knee)  Patient location during evaluation: PACU Anesthesia Type: MAC Level of consciousness: awake and alert Pain management: pain level controlled Vital Signs Assessment: post-procedure vital signs reviewed and stable Respiratory status: spontaneous breathing, nonlabored ventilation, respiratory function stable and patient connected to nasal cannula oxygen Cardiovascular status: stable and blood pressure returned to baseline Postop Assessment: no apparent nausea or vomiting Anesthetic complications: no     Last Vitals:  Vitals:   03/08/17 1637 03/08/17 1711  BP:  (!) 141/76  Pulse: 72 69  Resp: 14 16  Temp: 36.6 C 36.7 C  SpO2: 99% 98%    Last Pain:  Vitals:   03/08/17 1711  TempSrc: Temporal                 Precious Haws Piscitello

## 2017-04-24 ENCOUNTER — Other Ambulatory Visit: Payer: Self-pay | Admitting: Obstetrics and Gynecology

## 2017-04-24 DIAGNOSIS — Z01419 Encounter for gynecological examination (general) (routine) without abnormal findings: Secondary | ICD-10-CM | POA: Diagnosis not present

## 2017-04-24 DIAGNOSIS — Z1231 Encounter for screening mammogram for malignant neoplasm of breast: Secondary | ICD-10-CM

## 2017-05-02 DIAGNOSIS — Z1211 Encounter for screening for malignant neoplasm of colon: Secondary | ICD-10-CM | POA: Diagnosis not present

## 2017-06-08 DIAGNOSIS — M1712 Unilateral primary osteoarthritis, left knee: Secondary | ICD-10-CM | POA: Diagnosis not present

## 2017-06-08 DIAGNOSIS — S83232D Complex tear of medial meniscus, current injury, left knee, subsequent encounter: Secondary | ICD-10-CM | POA: Diagnosis not present

## 2017-06-08 DIAGNOSIS — S83272D Complex tear of lateral meniscus, current injury, left knee, subsequent encounter: Secondary | ICD-10-CM | POA: Diagnosis not present

## 2017-07-25 ENCOUNTER — Ambulatory Visit
Admission: RE | Admit: 2017-07-25 | Discharge: 2017-07-25 | Disposition: A | Discharge status: Home / Self Care | Payer: 59 | Source: Ambulatory Visit | Attending: Obstetrics and Gynecology | Admitting: Obstetrics and Gynecology

## 2017-07-25 DIAGNOSIS — Z1231 Encounter for screening mammogram for malignant neoplasm of breast: Secondary | ICD-10-CM | POA: Insufficient documentation

## 2018-01-21 DIAGNOSIS — E559 Vitamin D deficiency, unspecified: Secondary | ICD-10-CM | POA: Diagnosis not present

## 2018-01-29 DIAGNOSIS — I10 Essential (primary) hypertension: Secondary | ICD-10-CM | POA: Diagnosis not present

## 2018-01-29 DIAGNOSIS — E559 Vitamin D deficiency, unspecified: Secondary | ICD-10-CM | POA: Diagnosis not present

## 2018-05-01 ENCOUNTER — Other Ambulatory Visit: Payer: Self-pay | Admitting: Obstetrics and Gynecology

## 2018-05-01 DIAGNOSIS — Z1231 Encounter for screening mammogram for malignant neoplasm of breast: Secondary | ICD-10-CM

## 2018-05-01 DIAGNOSIS — Z01419 Encounter for gynecological examination (general) (routine) without abnormal findings: Secondary | ICD-10-CM | POA: Diagnosis not present

## 2018-05-01 DIAGNOSIS — Z1239 Encounter for other screening for malignant neoplasm of breast: Secondary | ICD-10-CM | POA: Diagnosis not present

## 2018-05-01 DIAGNOSIS — Z124 Encounter for screening for malignant neoplasm of cervix: Secondary | ICD-10-CM | POA: Diagnosis not present

## 2018-05-02 DIAGNOSIS — Z1211 Encounter for screening for malignant neoplasm of colon: Secondary | ICD-10-CM | POA: Diagnosis not present

## 2018-09-30 ENCOUNTER — Ambulatory Visit
Admission: RE | Admit: 2018-09-30 | Discharge: 2018-09-30 | Disposition: A | Discharge status: Home / Self Care | Payer: 59 | Source: Ambulatory Visit | Attending: Obstetrics and Gynecology | Admitting: Obstetrics and Gynecology

## 2018-09-30 ENCOUNTER — Other Ambulatory Visit: Payer: Self-pay

## 2018-09-30 DIAGNOSIS — Z1231 Encounter for screening mammogram for malignant neoplasm of breast: Secondary | ICD-10-CM

## 2018-12-20 ENCOUNTER — Other Ambulatory Visit
Admission: RE | Admit: 2018-12-20 | Discharge: 2018-12-20 | Disposition: A | Discharge status: Home / Self Care | Payer: 59 | Source: Ambulatory Visit | Attending: General Surgery | Admitting: General Surgery

## 2018-12-20 DIAGNOSIS — Z20828 Contact with and (suspected) exposure to other viral communicable diseases: Secondary | ICD-10-CM | POA: Insufficient documentation

## 2018-12-21 LAB — SARS CORONAVIRUS 2 (TAT 6-24 HRS): SARS Coronavirus 2: NEGATIVE

## 2018-12-24 ENCOUNTER — Encounter: Payer: Self-pay | Admitting: *Deleted

## 2018-12-25 ENCOUNTER — Ambulatory Visit: Payer: 59 | Admitting: Anesthesiology

## 2018-12-25 ENCOUNTER — Other Ambulatory Visit: Payer: Self-pay

## 2018-12-25 ENCOUNTER — Encounter: Admission: RE | Disposition: A | Discharge status: Home / Self Care | Payer: Self-pay | Source: Home / Self Care

## 2018-12-25 ENCOUNTER — Ambulatory Visit
Admission: RE | Admit: 2018-12-25 | Discharge: 2018-12-25 | Disposition: A | Discharge status: Home / Self Care | Payer: 59 | Attending: General Surgery | Admitting: General Surgery

## 2018-12-25 ENCOUNTER — Encounter: Payer: Self-pay | Admitting: *Deleted

## 2018-12-25 DIAGNOSIS — Z6841 Body Mass Index (BMI) 40.0 and over, adult: Secondary | ICD-10-CM | POA: Insufficient documentation

## 2018-12-25 DIAGNOSIS — Z79899 Other long term (current) drug therapy: Secondary | ICD-10-CM | POA: Diagnosis not present

## 2018-12-25 DIAGNOSIS — K219 Gastro-esophageal reflux disease without esophagitis: Secondary | ICD-10-CM | POA: Diagnosis not present

## 2018-12-25 DIAGNOSIS — M1990 Unspecified osteoarthritis, unspecified site: Secondary | ICD-10-CM | POA: Diagnosis not present

## 2018-12-25 DIAGNOSIS — I10 Essential (primary) hypertension: Secondary | ICD-10-CM | POA: Diagnosis not present

## 2018-12-25 DIAGNOSIS — Z1211 Encounter for screening for malignant neoplasm of colon: Secondary | ICD-10-CM | POA: Insufficient documentation

## 2018-12-25 DIAGNOSIS — K621 Rectal polyp: Secondary | ICD-10-CM | POA: Diagnosis not present

## 2018-12-25 HISTORY — PX: COLONOSCOPY WITH PROPOFOL: SHX5780

## 2018-12-25 SURGERY — COLONOSCOPY WITH PROPOFOL
Anesthesia: General

## 2018-12-25 MED ORDER — PROPOFOL 500 MG/50ML IV EMUL
INTRAVENOUS | Status: DC | PRN
  Administered 2018-12-25: 120 ug/kg/min via INTRAVENOUS

## 2018-12-25 MED ORDER — PROPOFOL 500 MG/50ML IV EMUL
INTRAVENOUS | Status: AC
  Filled 2018-12-25: qty 50

## 2018-12-25 MED ORDER — PHENYLEPHRINE HCL (PRESSORS) 10 MG/ML IV SOLN
INTRAVENOUS | Status: AC
  Filled 2018-12-25: qty 1

## 2018-12-25 MED ORDER — PHENYLEPHRINE HCL (PRESSORS) 10 MG/ML IV SOLN
INTRAVENOUS | Status: DC | PRN
  Administered 2018-12-25 (×2): 50 ug via INTRAVENOUS

## 2018-12-25 MED ORDER — FENTANYL CITRATE (PF) 100 MCG/2ML IJ SOLN
INTRAMUSCULAR | Status: AC
  Filled 2018-12-25: qty 2

## 2018-12-25 MED ORDER — MIDAZOLAM HCL 2 MG/2ML IJ SOLN
INTRAMUSCULAR | Status: DC | PRN
  Administered 2018-12-25: 2 mg via INTRAVENOUS

## 2018-12-25 MED ORDER — MIDAZOLAM HCL 2 MG/2ML IJ SOLN
INTRAMUSCULAR | Status: AC
  Filled 2018-12-25: qty 2

## 2018-12-25 MED ORDER — SODIUM CHLORIDE 0.9 % IV SOLN
INTRAVENOUS | Status: DC
  Administered 2018-12-25: 08:00:00 via INTRAVENOUS

## 2018-12-25 MED ORDER — FENTANYL CITRATE (PF) 100 MCG/2ML IJ SOLN
INTRAMUSCULAR | Status: DC | PRN
  Administered 2018-12-25: 50 ug via INTRAVENOUS

## 2018-12-25 NOTE — Transfer of Care (Signed)
Immediate Anesthesia Transfer of Care Note  Patient: Angela Pena  Procedure(s) Performed: COLONOSCOPY WITH PROPOFOL (N/A )  Patient Location: PACU  Anesthesia Type:General  Level of Consciousness: awake and sedated  Airway & Oxygen Therapy: Patient Spontanous Breathing and Patient connected to face mask oxygen  Post-op Assessment: Report given to RN and Post -op Vital signs reviewed and stable  Post vital signs: Reviewed and stable  Last Vitals:  Vitals Value Taken Time  BP    Temp    Pulse 83 12/25/18 0819  Resp 23 12/25/18 0819  SpO2 93 % 12/25/18 0819  Vitals shown include unvalidated device data.  Last Pain:  Vitals:   12/25/18 0722  PainSc: 0-No pain         Complications: No apparent anesthesia complications

## 2018-12-25 NOTE — Anesthesia Procedure Notes (Signed)
Performed by: Cook-Martin, Nicloe Frontera Pre-anesthesia Checklist: Patient identified, Emergency Drugs available, Suction available, Patient being monitored and Timeout performed Patient Re-evaluated:Patient Re-evaluated prior to induction Oxygen Delivery Method: Simple face mask Preoxygenation: Pre-oxygenation with 100% oxygen Induction Type: IV induction Ventilation: Oral airway inserted - appropriate to patient size Placement Confirmation: positive ETCO2 and CO2 detector       

## 2018-12-25 NOTE — Anesthesia Postprocedure Evaluation (Signed)
Anesthesia Post Note  Patient: Angela Pena  Procedure(s) Performed: COLONOSCOPY WITH PROPOFOL (N/A )  Patient location during evaluation: Endoscopy Anesthesia Type: General Level of consciousness: awake and alert and oriented Pain management: pain level controlled Vital Signs Assessment: post-procedure vital signs reviewed and stable Respiratory status: spontaneous breathing, nonlabored ventilation and respiratory function stable Cardiovascular status: blood pressure returned to baseline and stable Postop Assessment: no signs of nausea or vomiting Anesthetic complications: no     Last Vitals:  Vitals:   12/25/18 0819 12/25/18 0849  BP:  124/74  Pulse:    Resp:    Temp: (!) 36.1 C   SpO2:      Last Pain:  Vitals:   12/25/18 0849  TempSrc:   PainSc: 0-No pain                 Hershall Benkert

## 2018-12-25 NOTE — H&P (Signed)
Angela Pena [id number] 20-May-1955     HPI:  Healthy 63 y/o woman who had multiple tubular adenomas in the ascending and transverse colon at the time of her 2017 exam. For repeat study. She tolerated the prep well.   Medications Prior to Admission  Medication Sig Dispense Refill Last Dose  . amLODipine (NORVASC) 10 MG tablet Take 10 mg by mouth daily. TAKES AT NIGHT   12/24/2018 at 2130  . ergocalciferol (VITAMIN D2) 50000 units capsule Take 50,000 Units by mouth 2 (two) times a week. TAKES AT NIGHT   Past Week at Unknown time  . hydrochlorothiazide (HYDRODIURIL) 25 MG tablet Take 12.5 mg by mouth daily. TAKES AT NIGHT   12/24/2018 at [place]  . irbesartan (AVAPRO) 150 MG tablet Take 150 mg by mouth daily. TAKES AT NIGHT   12/24/2018 at 2130  . potassium chloride (KLOR-CON) 20 MEQ packet Take 20 mEq by mouth daily. TAKES AT NIGHT   12/24/2018 at 2130   Allergies  Allergen Reactions  . Quinine Derivatives Hives   Past Medical History:  Diagnosis Date  . Acid reflux   . Colon polyps   . Difficult intubation   . Family history of adverse reaction to anesthesia    father had short neck and was difficult intubating  . Hypertension   . Morbid obesity ([place])   . Osteoarthritis of both knees   . Pneumonia   . Vitamin D deficiency    Past Surgical History:  Procedure Laterality Date  . ABDOMINAL HYSTERECTOMY  10/2004  . BREAST CYST ASPIRATION Right 1990s   benign  . CHOLECYSTECTOMY    . COLON SURGERY    . COLONOSCOPY WITH PROPOFOL N/A 10/20/2015   Procedure: COLONOSCOPY WITH PROPOFOL;  Surgeon: Manya Silvas, MD;  Location: [facility] ENDOSCOPY;  Service: Endoscopy;  Laterality: N/A;  . ESOPHAGOGASTRODUODENOSCOPY  07/29/2010  . KNEE ARTHROSCOPY WITH MEDIAL MENISECTOMY Left 03/08/2017   Procedure: KNEE ARTHROSCOPY WITH MEDIAL MENISECTOMY;  Surgeon: Corky Mull, MD;  Location: [facility] ORS;  Service: Orthopedics;  Laterality: Left;  . TONSILLECTOMY     Social History   Socioeconomic History   . Marital status: Married    Spouse name: Not on file  . Number of children: Not on file  . Years of education: Not on file  . Highest education level: Not on file  Occupational History  . Not on file  Social Needs  . Financial resource strain: Not on file  . Food insecurity    Worry: Not on file    Inability: Not on file  . Transportation needs    Medical: Not on file    Non-medical: Not on file  Tobacco Use  . Smoking status: Never Smoker  . Smokeless tobacco: Never Used  Substance and Sexual Activity  . Alcohol use: No    Frequency: Never  . Drug use: No  . Sexual activity: Not on file  Lifestyle  . Physical activity    Days per week: Not on file    Minutes per session: Not on file  . Stress: Not on file  Relationships  . Social Herbalist on phone: Not on file    Gets together: Not on file    Attends religious service: Not on file    Active member of club or organization: Not on file    Attends meetings of clubs or organizations: Not on file    Relationship status: Not on file  . Intimate partner violence  Fear of current or ex partner: Not on file    Emotionally abused: Not on file    Physically abused: Not on file    Forced sexual activity: Not on file  Other Topics Concern  . Not on file  Social History Narrative  . Not on file   Social History   Social History Narrative  . Not on file     ROS: Negative.     PE: HEENT: Negative. Lungs: Clear. Cardio: RR.   Assessment/Plan:  Proceed with planned endoscopy. [place] Angela Pena 12/25/2018

## 2018-12-25 NOTE — Anesthesia Post-op Follow-up Note (Signed)
Anesthesia QCDR form completed.        

## 2018-12-25 NOTE — Anesthesia Preprocedure Evaluation (Signed)
Anesthesia Evaluation  Patient identified by MRN, date of birth, ID band Patient awake    Reviewed: Allergy & Precautions, NPO status , Patient's Chart, lab work & pertinent test results  History of Anesthesia Complications Negative for: history of anesthetic complications  Airway Mallampati: II  TM Distance: >3 FB Neck ROM: Full    Dental no notable dental hx.    Pulmonary neg pulmonary ROS, neg sleep apnea, neg COPD,    breath sounds clear to auscultation- rhonchi (-) wheezing      Cardiovascular hypertension, Pt. on medications (-) CAD, (-) Past MI, (-) Cardiac Stents and (-) CABG  Rhythm:Regular Rate:Normal - Systolic murmurs and - Diastolic murmurs    Neuro/Psych neg Seizures negative neurological ROS  negative psych ROS   GI/Hepatic Neg liver ROS, GERD  ,  Endo/Other  negative endocrine ROSneg diabetes  Renal/GU negative Renal ROS     Musculoskeletal  (+) Arthritis ,   Abdominal (+) + obese,   Peds  Hematology negative hematology ROS (+)   Anesthesia Other Findings Past Medical History: No date: Acid reflux No date: Colon polyps No date: Difficult intubation No date: Family history of adverse reaction to anesthesia     Comment:  father had short neck and was difficult intubating No date: Hypertension No date: Morbid obesity ([place]) No date: Osteoarthritis of both knees No date: Pneumonia No date: Vitamin D deficiency   Reproductive/Obstetrics                             Anesthesia Physical Anesthesia Plan  ASA: II  Anesthesia Plan: General   Post-op Pain Management:    Induction: Intravenous  PONV Risk Score and Plan: 2 and Propofol infusion  Airway Management Planned: Natural Airway  Additional Equipment:   Intra-op Plan:   Post-operative Plan:   Informed Consent: I have reviewed the patients History and Physical, chart, labs and discussed the procedure  including the risks, benefits and alternatives for the proposed anesthesia with the patient or authorized representative who has indicated his/her understanding and acceptance.     Dental advisory given  Plan Discussed with: CRNA and Anesthesiologist  Anesthesia Plan Comments:         Anesthesia Quick Evaluation

## 2018-12-26 LAB — SURGICAL PATHOLOGY

## 2018-12-27 ENCOUNTER — Encounter: Payer: Self-pay | Admitting: General Surgery

## 2018-12-31 NOTE — Op Note (Signed)
[facility] Gastroenterology Patient Name: Angela Pena Procedure Date: 12/25/2018 7:54 AM MRN: [id number] Account #: [id number] Date of Birth: [birth year] Admit Type: Outpatient Age: 63 Room: [facility] ENDO ROOM 1 Gender: Female Note Status: Finalized Procedure:            Colonoscopy Indications:          High risk colon cancer surveillance: Personal history                        of colonic polyps Providers:            Robert Bellow, MD Medicines:            Monitored Anesthesia Care Complications:        No immediate complications. Procedure:            Pre-Anesthesia Assessment:                       - Prior to the procedure, a History and Physical was                        performed, and patient medications, allergies and                        sensitivities were reviewed. The patient's tolerance of                        previous anesthesia was reviewed.                       - The risks and benefits of the procedure and the                        sedation options and risks were discussed with the                        patient. All questions were answered and informed                        consent was obtained.                       After obtaining informed consent, the colonoscope was                        passed under direct vision. Throughout the procedure,                        the patient's blood pressure, pulse, and oxygen                        saturations were monitored continuously. The                        Colonoscope was introduced through the anus and                        advanced to the the cecum, identified by appendiceal                        orifice and ileocecal valve. The colonoscopy was  performed without difficulty. The patient tolerated the                        procedure well. The quality of the bowel preparation                        was excellent. Findings:      A 4 mm polyp was found in the rectum.  The polyp was sessile. Biopsies       were taken with a cold forceps for histology. Impression:           - One 4 mm polyp in the rectum. Biopsied. Recommendation:       - Telephone endoscopist for pathology results in 1 week. Procedure Code(s):    --- Professional ---                       [phone], Colonoscopy, flexible; with biopsy, single or                        multiple Diagnosis Code(s):    --- Professional ---                       Z86.010, Personal history of colonic polyps                       K62.1, Rectal polyp CPT copyright 2019 [employer]. All rights reserved. The codes documented in this report are preliminary and upon coder review may  be revised to meet current compliance requirements. Robert Bellow, MD 12/31/2018 10:55:32 AM This report has been signed electronically. Number of Addenda: 0 Note Initiated On: 12/25/2018 7:54 AM Scope Withdrawal Time: 0 hours 9 minutes 24 seconds  Total Procedure Duration: 0 hours 13 minutes 29 seconds       [facility]

## 2019-05-08 ENCOUNTER — Other Ambulatory Visit: Payer: Self-pay | Admitting: Obstetrics and Gynecology

## 2019-05-08 DIAGNOSIS — Z1231 Encounter for screening mammogram for malignant neoplasm of breast: Secondary | ICD-10-CM

## 2019-10-02 ENCOUNTER — Ambulatory Visit
Admission: RE | Admit: 2019-10-02 | Discharge: 2019-10-02 | Disposition: A | Discharge status: Home / Self Care | Payer: 59 | Source: Ambulatory Visit | Attending: Obstetrics and Gynecology | Admitting: Obstetrics and Gynecology

## 2019-10-02 DIAGNOSIS — Z1231 Encounter for screening mammogram for malignant neoplasm of breast: Secondary | ICD-10-CM

## 2020-06-29 ENCOUNTER — Other Ambulatory Visit: Payer: Self-pay | Admitting: Obstetrics and Gynecology

## 2020-06-29 DIAGNOSIS — Z1231 Encounter for screening mammogram for malignant neoplasm of breast: Secondary | ICD-10-CM

## 2020-10-04 ENCOUNTER — Ambulatory Visit
Admission: RE | Admit: 2020-10-04 | Discharge: 2020-10-04 | Disposition: A | Discharge status: Home / Self Care | Payer: 59 | Source: Ambulatory Visit | Attending: Obstetrics and Gynecology | Admitting: Obstetrics and Gynecology

## 2020-10-04 ENCOUNTER — Other Ambulatory Visit: Payer: Self-pay

## 2020-10-04 DIAGNOSIS — Z1231 Encounter for screening mammogram for malignant neoplasm of breast: Secondary | ICD-10-CM | POA: Diagnosis present

## 2021-07-05 ENCOUNTER — Other Ambulatory Visit: Payer: Self-pay | Admitting: Obstetrics and Gynecology

## 2021-07-05 DIAGNOSIS — Z1231 Encounter for screening mammogram for malignant neoplasm of breast: Secondary | ICD-10-CM

## 2021-09-19 IMAGING — MG MM DIGITAL SCREENING BILAT W/ TOMO AND CAD
8 of 16 series · 8 of 40 positions shown · non-contrast
Comparison: Previous exam(s).

CLINICAL DATA: Screening.

EXAM:
DIGITAL SCREENING BILATERAL MAMMOGRAM WITH TOMOSYNTHESIS AND CAD
TECHNIQUE: Bilateral screening digital craniocaudal and mediolateral oblique
mammograms were obtained. Bilateral screening digital breast
tomosynthesis was performed. The images were evaluated with
computer-aided detection.

[L CC synth-2D (1 of 2)]
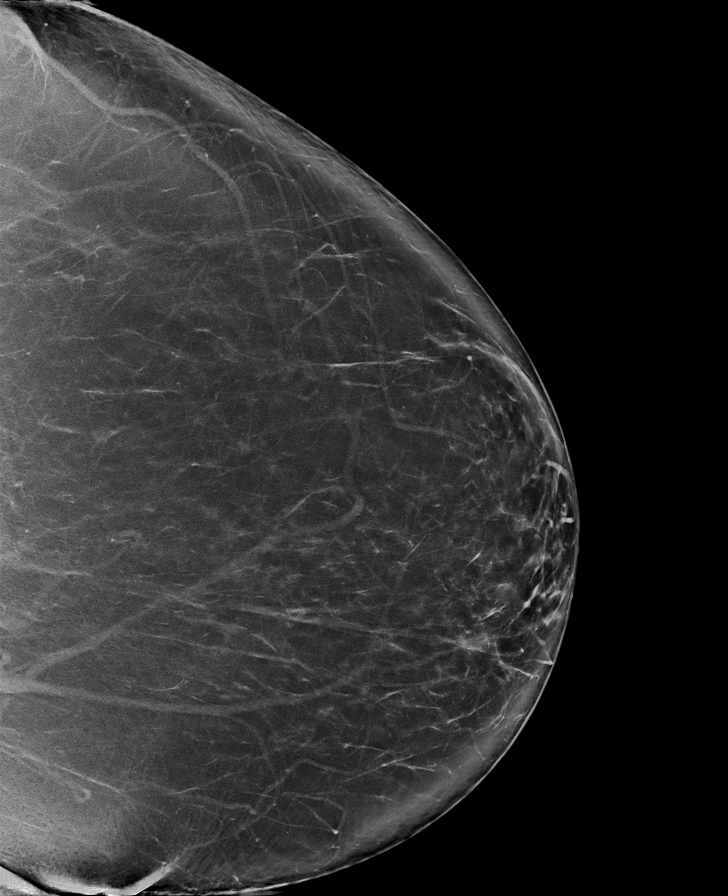

[L CC synth-2D (2 of 2)]
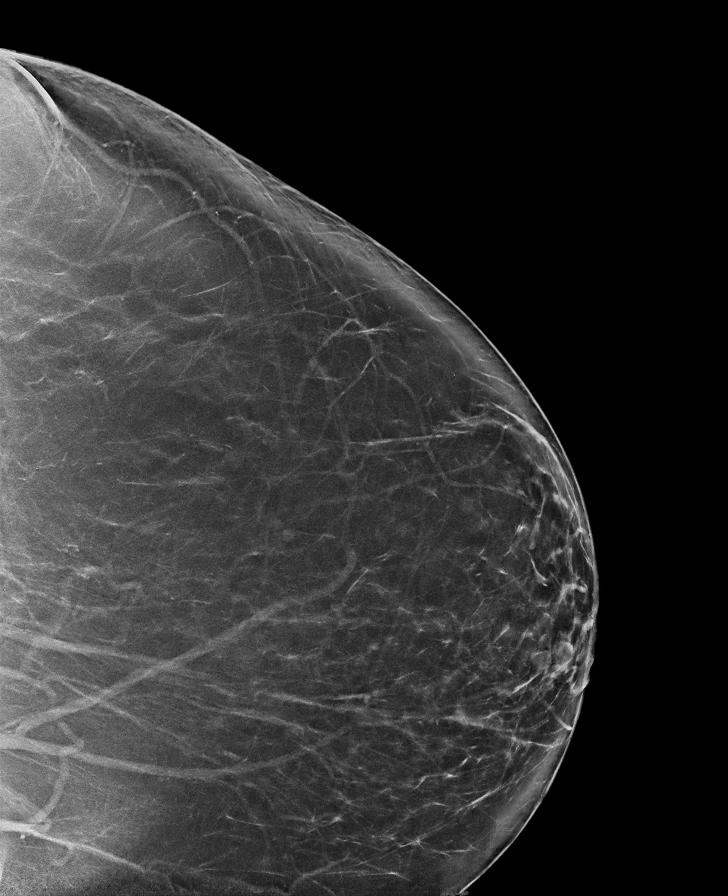

[L MLO synth-2D]
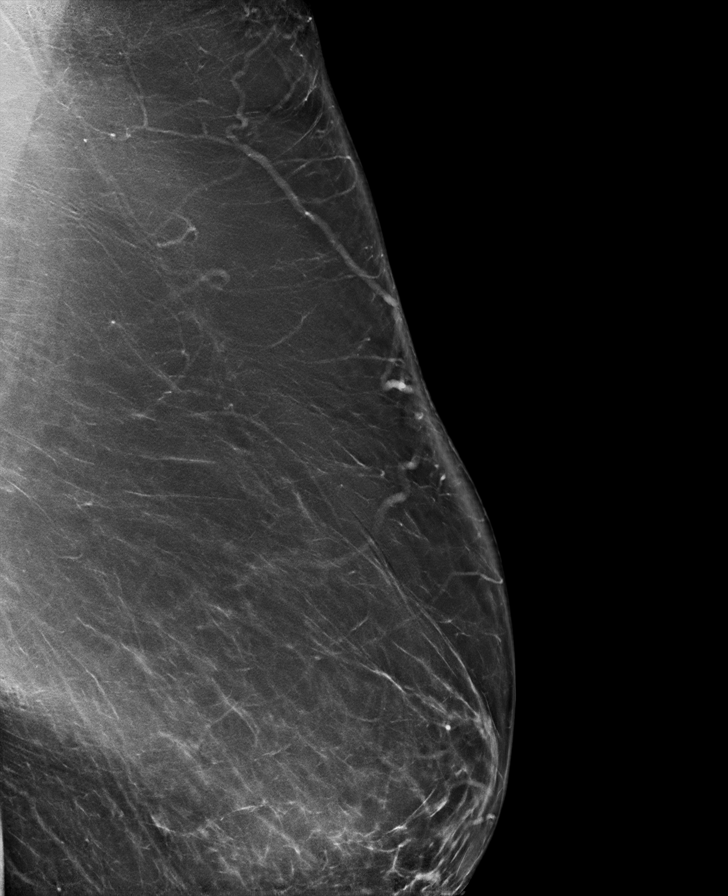

[R CV synth-2D]
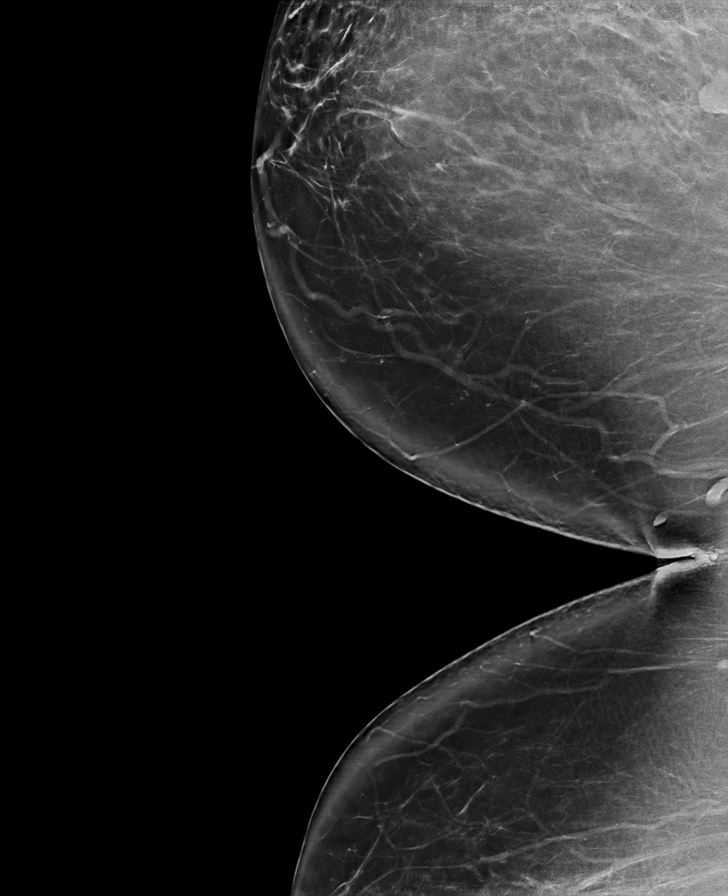

[R CC synth-2D]
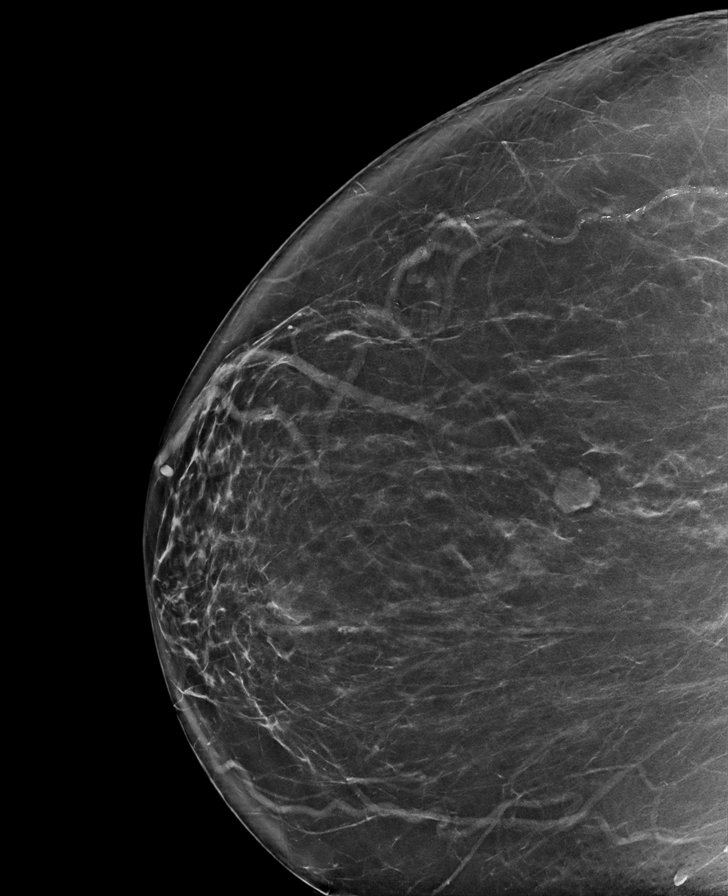

[R MLO synth-2D (1 of 2)]
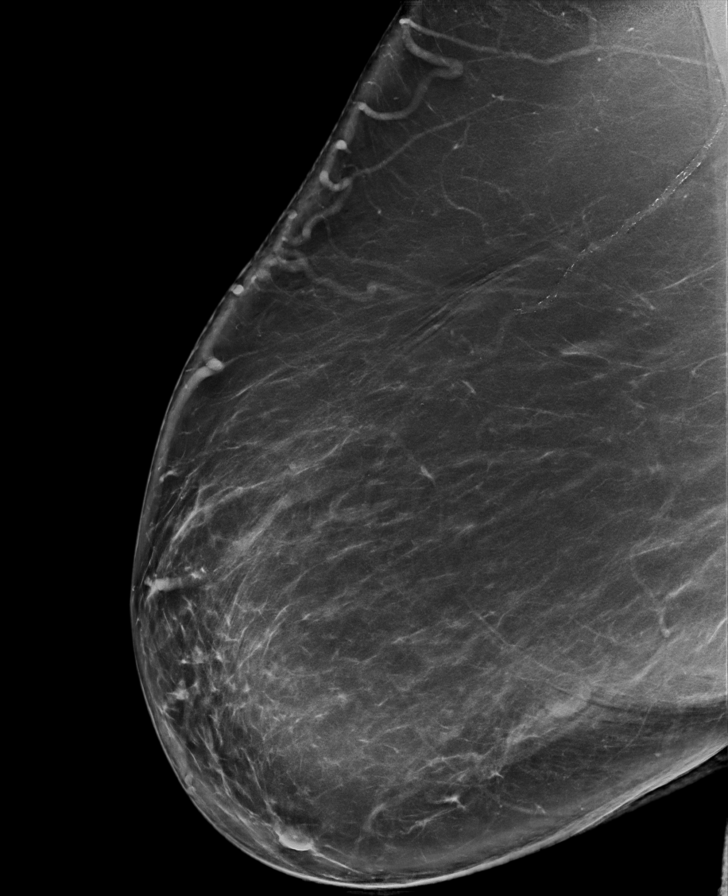

[R MLO synth-2D (2 of 2)]
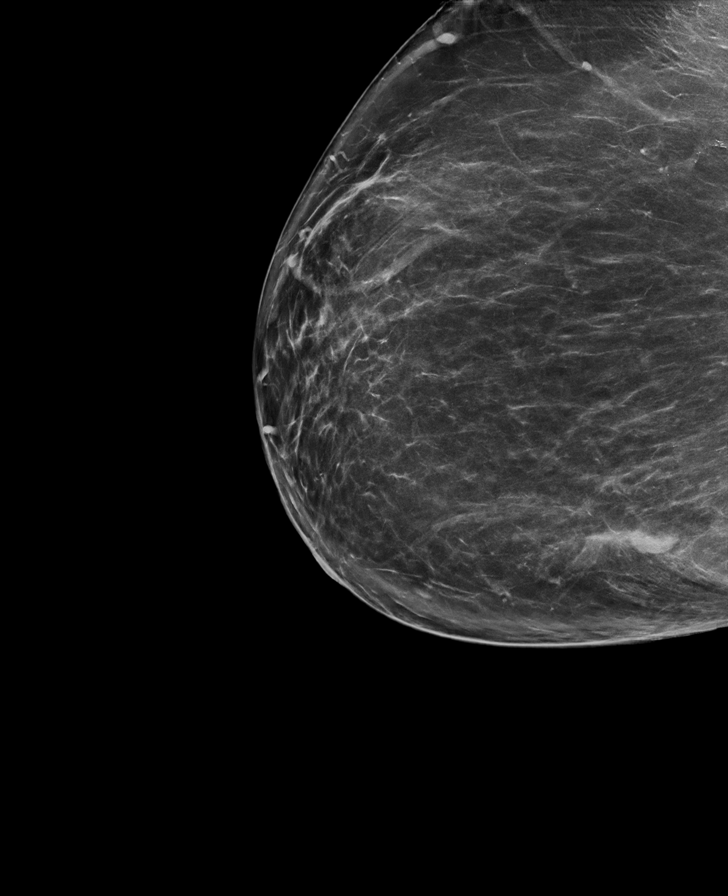

[L MLO tomo · tomo slice 52/103.0]
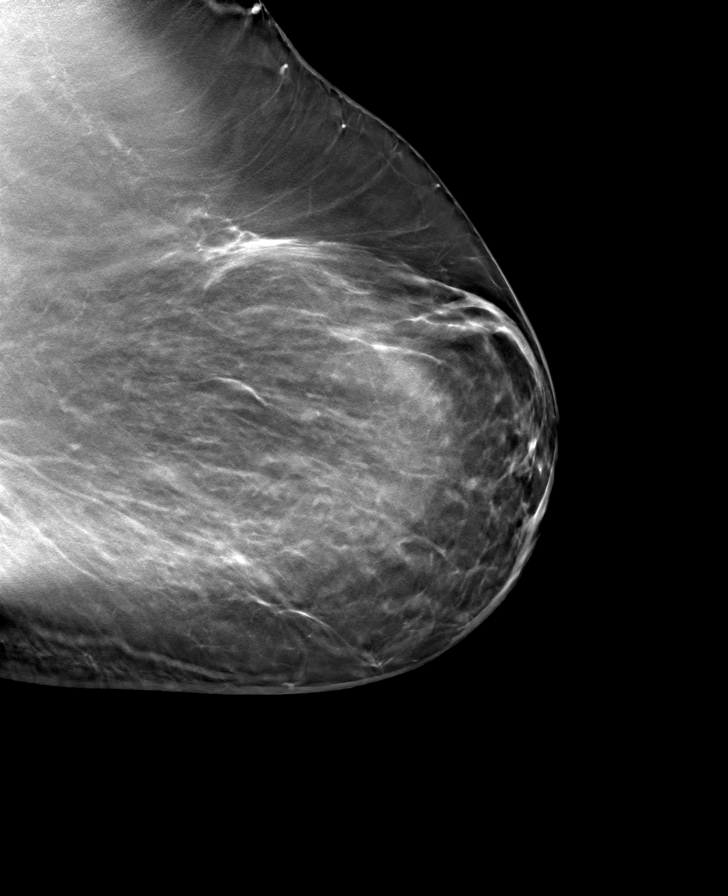

[8 of 40 positions shown; findings below may reference images not displayed]

ACR Breast Density Category b: There are scattered areas of
fibroglandular density.
FINDINGS: There are no findings suspicious for malignancy.
IMPRESSION: No mammographic evidence of malignancy. A result letter of this
screening mammogram will be mailed directly to the patient.

RECOMMENDATION:
Screening mammogram in one year. (Code:51-O-LD2)

BI-RADS CATEGORY  1: Negative.

## 2021-10-05 ENCOUNTER — Ambulatory Visit
Admission: RE | Admit: 2021-10-05 | Discharge: 2021-10-05 | Disposition: A | Discharge status: Home / Self Care | Payer: 59 | Source: Ambulatory Visit | Attending: Obstetrics and Gynecology | Admitting: Obstetrics and Gynecology

## 2021-10-05 DIAGNOSIS — Z1231 Encounter for screening mammogram for malignant neoplasm of breast: Secondary | ICD-10-CM | POA: Diagnosis present

## 2022-07-12 ENCOUNTER — Other Ambulatory Visit: Payer: Self-pay | Admitting: Obstetrics and Gynecology

## 2022-07-12 DIAGNOSIS — Z1231 Encounter for screening mammogram for malignant neoplasm of breast: Secondary | ICD-10-CM

## 2022-10-10 ENCOUNTER — Ambulatory Visit
Admission: RE | Admit: 2022-10-10 | Discharge: 2022-10-10 | Disposition: A | Discharge status: Home / Self Care | Payer: 59 | Source: Ambulatory Visit | Attending: Obstetrics and Gynecology | Admitting: Obstetrics and Gynecology

## 2022-10-10 DIAGNOSIS — Z1231 Encounter for screening mammogram for malignant neoplasm of breast: Secondary | ICD-10-CM | POA: Diagnosis not present

## 2023-07-18 ENCOUNTER — Other Ambulatory Visit: Payer: Self-pay | Admitting: Obstetrics and Gynecology

## 2023-07-18 DIAGNOSIS — Z1231 Encounter for screening mammogram for malignant neoplasm of breast: Secondary | ICD-10-CM

## 2023-10-11 ENCOUNTER — Ambulatory Visit
Admission: RE | Admit: 2023-10-11 | Discharge: 2023-10-11 | Disposition: A | Discharge status: Home / Self Care | Source: Ambulatory Visit | Attending: Obstetrics and Gynecology | Admitting: Obstetrics and Gynecology

## 2023-10-11 DIAGNOSIS — Z1231 Encounter for screening mammogram for malignant neoplasm of breast: Secondary | ICD-10-CM | POA: Diagnosis present
